# Patient Record
Sex: Female | Born: 2000 | Race: Black or African American | Hispanic: No | Marital: Single | Smoking: Never smoker
Health system: Southern US, Community
[De-identification: ages and names within clinical notes are randomized; demographics above are authoritative.]

## PROBLEM LIST (undated history)

## (undated) DIAGNOSIS — F909 Attention-deficit hyperactivity disorder, unspecified type: Secondary | ICD-10-CM

## (undated) HISTORY — PX: TYMPANOSTOMY TUBE PLACEMENT: SHX32

---
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
## 2022-06-02 DIAGNOSIS — R0981 Nasal congestion: Secondary | ICD-10-CM | POA: Diagnosis not present

## 2022-06-02 DIAGNOSIS — Z130 Encounter for screening for diseases of the blood and blood-forming organs and certain disorders involving the immune mechanism: Secondary | ICD-10-CM | POA: Diagnosis not present

## 2022-06-02 DIAGNOSIS — F419 Anxiety disorder, unspecified: Secondary | ICD-10-CM | POA: Diagnosis not present

## 2022-06-02 DIAGNOSIS — Z13220 Encounter for screening for lipoid disorders: Secondary | ICD-10-CM | POA: Diagnosis not present

## 2022-06-02 DIAGNOSIS — F909 Attention-deficit hyperactivity disorder, unspecified type: Secondary | ICD-10-CM | POA: Diagnosis not present

## 2022-06-02 DIAGNOSIS — Z0000 Encounter for general adult medical examination without abnormal findings: Secondary | ICD-10-CM | POA: Diagnosis not present

## 2022-06-02 DIAGNOSIS — E559 Vitamin D deficiency, unspecified: Secondary | ICD-10-CM | POA: Diagnosis not present

## 2022-06-30 DIAGNOSIS — E559 Vitamin D deficiency, unspecified: Secondary | ICD-10-CM | POA: Diagnosis not present

## 2022-06-30 DIAGNOSIS — F909 Attention-deficit hyperactivity disorder, unspecified type: Secondary | ICD-10-CM | POA: Diagnosis not present

## 2022-07-15 DIAGNOSIS — R040 Epistaxis: Secondary | ICD-10-CM | POA: Diagnosis not present

## 2022-07-15 DIAGNOSIS — H6063 Unspecified chronic otitis externa, bilateral: Secondary | ICD-10-CM | POA: Diagnosis not present

## 2022-07-15 DIAGNOSIS — J301 Allergic rhinitis due to pollen: Secondary | ICD-10-CM | POA: Diagnosis not present

## 2022-08-04 DIAGNOSIS — F419 Anxiety disorder, unspecified: Secondary | ICD-10-CM | POA: Diagnosis not present

## 2022-08-04 DIAGNOSIS — F909 Attention-deficit hyperactivity disorder, unspecified type: Secondary | ICD-10-CM | POA: Diagnosis not present

## 2022-09-10 ENCOUNTER — Other Ambulatory Visit: Payer: Self-pay | Admitting: Family

## 2022-09-10 DIAGNOSIS — D500 Iron deficiency anemia secondary to blood loss (chronic): Secondary | ICD-10-CM

## 2022-09-10 DIAGNOSIS — N944 Primary dysmenorrhea: Secondary | ICD-10-CM

## 2022-09-10 DIAGNOSIS — N920 Excessive and frequent menstruation with regular cycle: Secondary | ICD-10-CM

## 2022-09-17 ENCOUNTER — Other Ambulatory Visit: Payer: Self-pay | Admitting: Family

## 2022-09-17 ENCOUNTER — Encounter: Payer: Self-pay | Admitting: Family

## 2022-09-17 DIAGNOSIS — N920 Excessive and frequent menstruation with regular cycle: Secondary | ICD-10-CM

## 2022-09-17 DIAGNOSIS — N944 Primary dysmenorrhea: Secondary | ICD-10-CM

## 2022-09-17 DIAGNOSIS — D500 Iron deficiency anemia secondary to blood loss (chronic): Secondary | ICD-10-CM

## 2022-09-17 MED ORDER — NORETHIN ACE-ETH ESTRAD-FE 1.5-30 MG-MCG PO TABS
ORAL_TABLET | ORAL | 2 refills | Status: DC
Start: 2022-09-17 — End: 2023-10-02

## 2022-09-26 ENCOUNTER — Encounter: Payer: Self-pay | Admitting: Family

## 2022-09-26 ENCOUNTER — Telehealth (INDEPENDENT_AMBULATORY_CARE_PROVIDER_SITE_OTHER): Payer: BC Managed Care – PPO | Admitting: Family

## 2022-09-26 DIAGNOSIS — L700 Acne vulgaris: Secondary | ICD-10-CM

## 2022-09-26 DIAGNOSIS — N939 Abnormal uterine and vaginal bleeding, unspecified: Secondary | ICD-10-CM

## 2022-09-26 DIAGNOSIS — N921 Excessive and frequent menstruation with irregular cycle: Secondary | ICD-10-CM

## 2022-09-26 MED ORDER — HYDROCORTISONE 0.5 % EX OINT
1.0000 | TOPICAL_OINTMENT | Freq: Two times a day (BID) | CUTANEOUS | 0 refills | Status: AC
Start: 2022-09-26 — End: ?

## 2022-09-26 MED ORDER — ADAPALENE 0.1 % EX CREA
TOPICAL_CREAM | Freq: Every day | CUTANEOUS | 2 refills | Status: DC
Start: 2022-09-26 — End: 2022-10-15

## 2022-09-26 NOTE — Progress Notes (Signed)
THIS RECORD MAY CONTAIN CONFIDENTIAL INFORMATION THAT SHOULD NOT BE RELEASED WITHOUT REVIEW OF THE SERVICE PROVIDER.  Virtual Follow-Up Visit via Video Note  I connected with Morgan Durham  on 09/26/22 at 11:00 AM EDT by a video enabled telemedicine application and verified that I am speaking with the correct person using two identifiers.   Patient/parent location: apartment, Diplomatic Services operational officer location: remote [place]   I discussed the limitations of evaluation and management by telemedicine and the availability of in person appointments.  I discussed that the purpose of this telehealth visit is to provide medical care while limiting exposure to the novel coronavirus.  The patient expressed understanding and agreed to proceed.   Morgan Durham is a 22 y.o. female referred by Izola Price, MD here today for follow-up of breakthrough bleeding on birth control pills.   History was provided by the patient.  Supervising Physician: Dr. Theadore Nan   Plan from Last Visit 11/04/21  -screening for infections today  -also discussed condom use, EC and birth control counseling  -does not want to restart at this time; will send EC to pharmacy so she has access as needed  -negative pregnancy test today  -follow up in my chart video in one week or sooner pending results    1. Vaginal irritation 2. Routine screening for STI (sexually transmitted infection) - WET PREP BY MOLECULAR PROBE - RPR - HIV Antibody (routine testing w rflx) - C. trachomatis/N. gonorrhoeae RNA   3. 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 (Patient not taking: Reported on 11/04/2021)     ALPRAZolam (NIRAVAM) 0.25 MG dissolvable tablet Take 1 tablet (0.25 mg total) by mouth once as needed for up to 1 dose for anxiety. (Patient not taking: Reported on 04/09/2020) 4 tablet 0   clindamycin-benzoyl peroxide (BENZACLIN) gel APPLY TOPICALLY EVERY MORNING. (Patient not taking: Reported on 11/04/2021) 25 g 11   escitalopram (LEXAPRO) 5 MG tablet Take 10 mg by mouth daily.     hydrOXYzine (ATARAX/VISTARIL) 25 MG tablet Take 1 tablet (25 mg total) by mouth every 8 (eight) hours as needed for anxiety. (Patient not taking: Reported on 11/04/2021) 20 tablet 0   hydrOXYzine (VISTARIL) 25 MG capsule Take 1 capsule (25 mg total) by mouth daily. (Patient not taking: Reported on 11/04/2021) 30 capsule 0   metoCLOPramide (REGLAN) 10 MG/10ML SOLN Take 10 mLs (10 mg total) by mouth every 8 (eight) hours as needed for nausea. (Patient not taking: Reported on 11/04/2021) 120 mL 0   Multiple Vitamin (MULTIVITAMIN) tablet Take 1 tablet by mouth daily. (Patient not taking: Reported on 11/04/2021)     Naproxen 375 MG TBEC TAKE 1 TABLET (375 MG TOTAL) BY MOUTH IN THE MORNING AND AT BEDTIME. (Patient not taking: Reported on  11/04/2021) 60 tablet 3   norethindrone-ethinyl estradiol-iron (JUNEL FE 1.5/30) 1.5-30 MG-MCG tablet TAKE ONE PILL BY MOUTH DAILY. YOU WILL NOT TAKE PLACEBO PILLS. 112 tablet 2   polyethylene glycol powder (GLYCOLAX/MIRALAX) 17 GM/SCOOP powder Take 17 g by mouth daily. (Patient not taking: Reported on 04/09/2020) 578 g 6   sertraline (ZOLOFT) 25 MG tablet  Take 25 mg by mouth daily. (Patient not taking: Reported on 11/04/2021)     No facility-administered medications prior to visit.     Patient Active Problem List   Diagnosis Date Noted   Menorrhagia with regular cycle 05/26/2019   Primary dysmenorrhea 05/26/2019   Oppositional defiant disorder 03/17/2016   ADHD (attention deficit hyperactivity disorder), combined type 06/06/2015     The following portions of the patient's history were reviewed and updated as appropriate: allergies, current medications, past family history, past medical history, past social history, past surgical history, and problem list.  Visual Observations/Objective:   General Appearance: Well nourished well developed, in no apparent distress.  Eyes: conjunctiva no swelling or erythema ENT/Mouth: No hoarseness, No cough for duration of visit.  Neck: Supple  Respiratory: Respiratory effort normal, normal rate, no retractions or distress.   Cardio: Appears well-perfused, noncyanotic Musculoskeletal: no obvious deformity Skin: visible skin without rashes, ecchymosis, erythema; scant acne on forehead, minimal on cheeks, some hyperpigmentation noted  Neuro: Awake and oriented X 3,  Psych:  normal affect, Insight and Judgment appropriate.    Assessment/Plan: 1. Breakthrough bleeding on birth control pills -advised to take one week off pills then restart to reset cycle  -return precautions reviewe d -can consider change from 1st gen to 2nd gen birth control if needed   2. Acne vulgaris -trial of differin and hydrocortisone for post-inflammatory hyperpigmented areas - adapalene (DIFFERIN) 0.1 % cream; Apply topically at bedtime.  Dispense: 45 g; Refill: 2 - hydrocortisone ointment 0.5 %; Apply 1 Application topically 2 (two) times daily. Apply to affected inflammed areas.  Dispense: 30 g; Refill: 0    I discussed the assessment and treatment plan with the patient and/or parent/guardian.  They were provided an  opportunity to ask questions and all were answered.  They agreed with the plan and demonstrated an understanding of the instructions. They were advised to call back or seek an in-person evaluation in the emergency room if the symptoms worsen or if the condition fails to improve as anticipated.   Follow-up:   as needed    Georges Mouse, NP    CC: Izola Price, MD, Izola Price, MD

## 2022-10-03 DIAGNOSIS — H04123 Dry eye syndrome of bilateral lacrimal glands: Secondary | ICD-10-CM | POA: Diagnosis not present

## 2022-10-03 DIAGNOSIS — H1045 Other chronic allergic conjunctivitis: Secondary | ICD-10-CM | POA: Diagnosis not present

## 2022-10-03 DIAGNOSIS — H5203 Hypermetropia, bilateral: Secondary | ICD-10-CM | POA: Diagnosis not present

## 2022-10-14 ENCOUNTER — Encounter: Payer: Self-pay | Admitting: Family

## 2022-10-15 ENCOUNTER — Other Ambulatory Visit: Payer: Self-pay | Admitting: Family

## 2022-12-16 IMAGING — CT CT HEAD W/O CM
4 series · 17 of 47 positions shown, 19 images · non-contrast
Comparison: None.

CLINICAL DATA: Altered mental status

EXAM:
CT HEAD WITHOUT CONTRAST
TECHNIQUE: Contiguous axial images were obtained from the base of the skull
through the vertex without intravenous contrast.

[Series 3: head wo · axial · 0.41mm/px · z∈[-173,-53]mm · 7 of 32 slices shown, 9 images]
[im 4/32  brain]
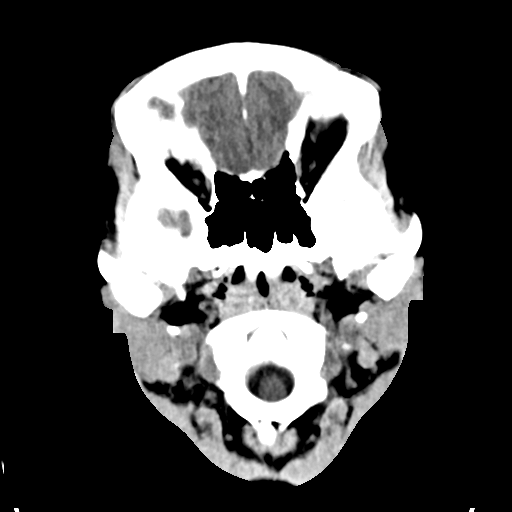
[im 4/32  bone]
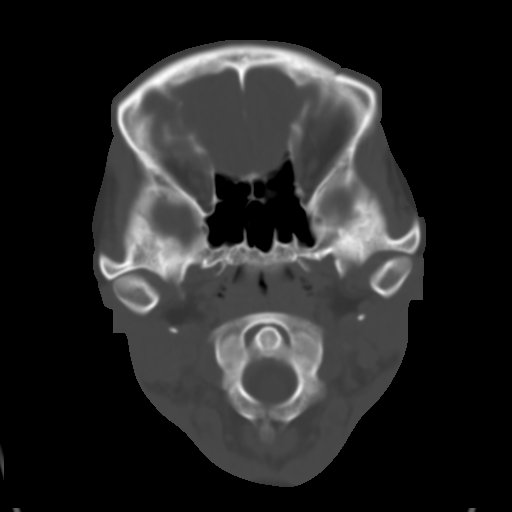
[im 8/32  brain]
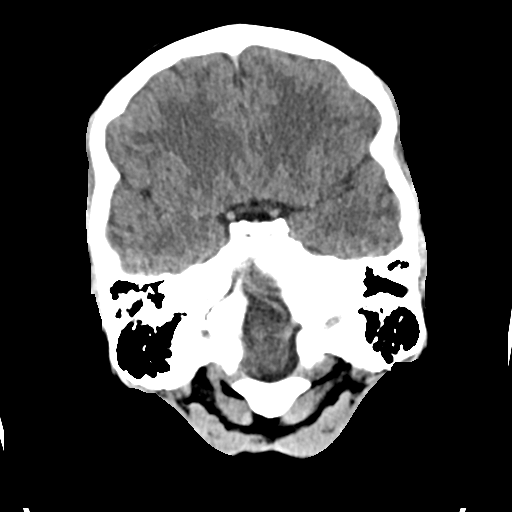
[im 12/32  brain]
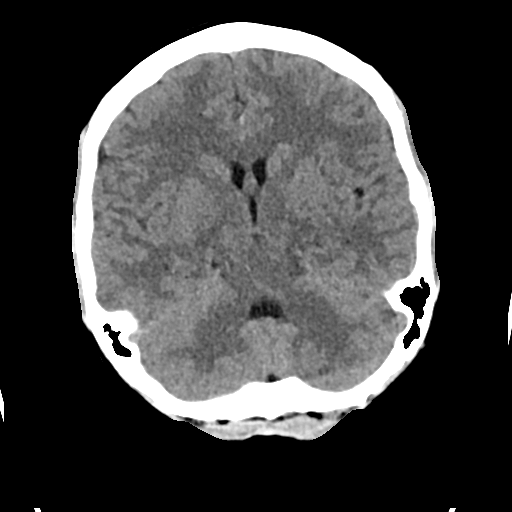
[im 16/32  brain]
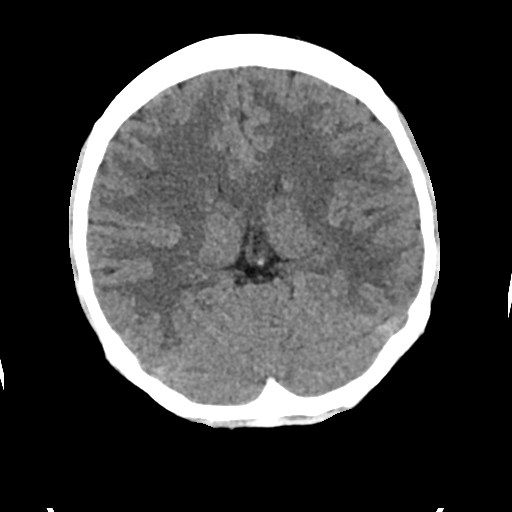
[im 20/32  brain]
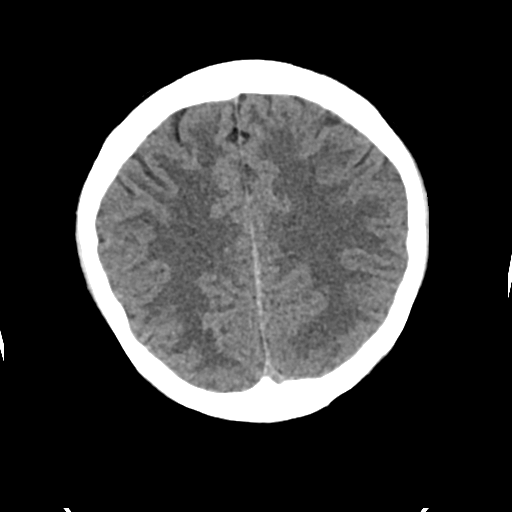
[im 20/32  bone]
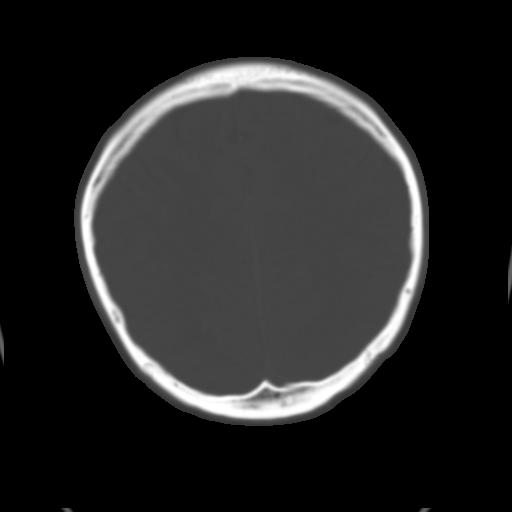
[im 24/32  brain]
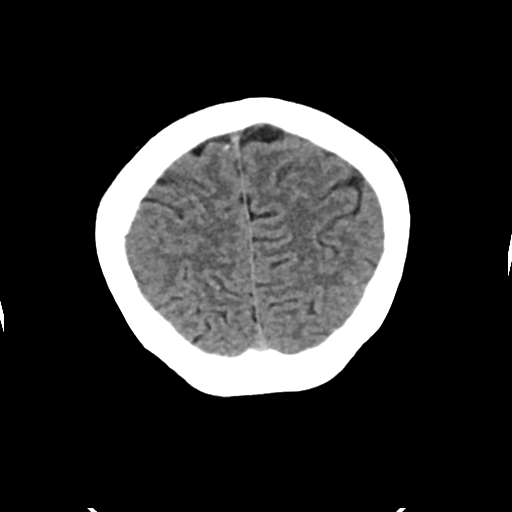
[im 28/32  brain]
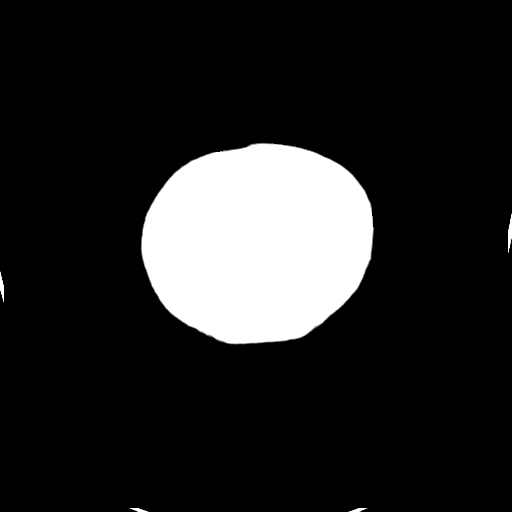

[Series 4: head bone · axial · 0.41mm/px · z∈[-174,-118]mm · 4 of 79 slices shown]
[im 8/79  bone]
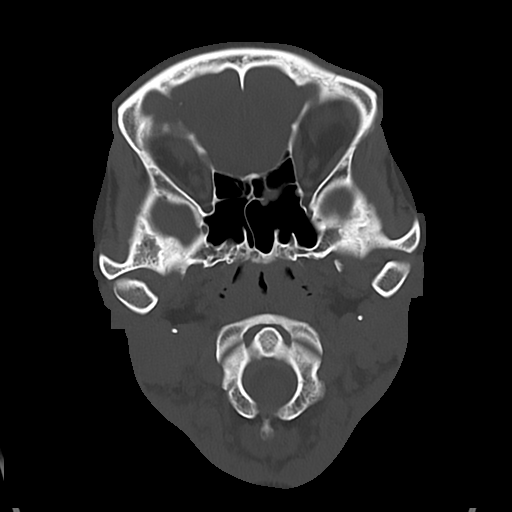
[im 16/79  bone]
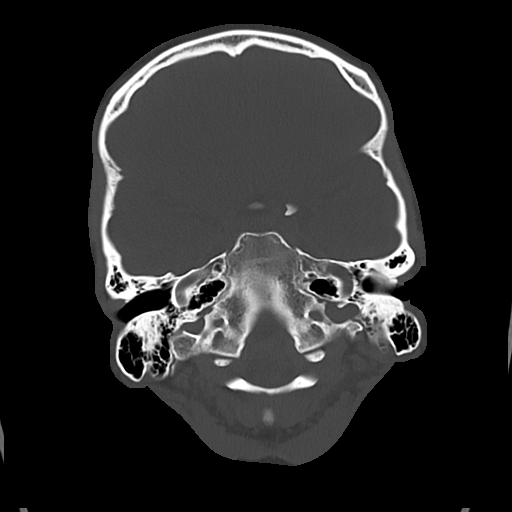
[im 24/79  bone]
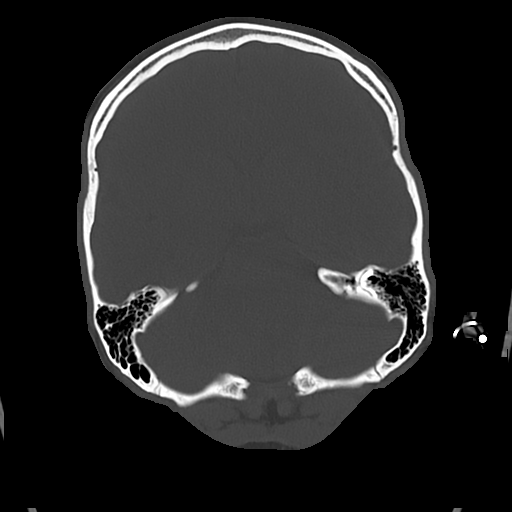
[im 36/79  bone]
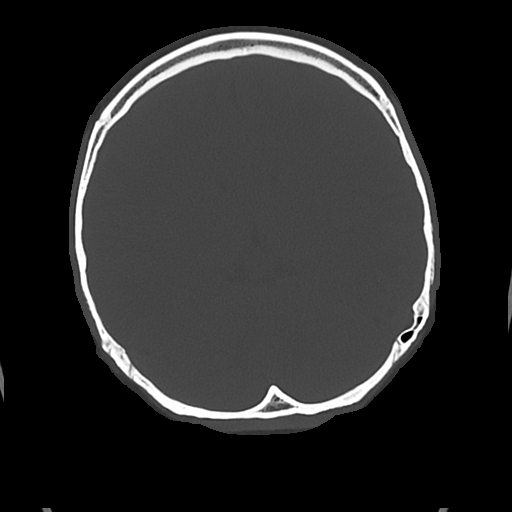

[Series 5: cor soft · coronal · 0.35mm/px · 3 of 66 slices shown]
[im 22/66  brain]
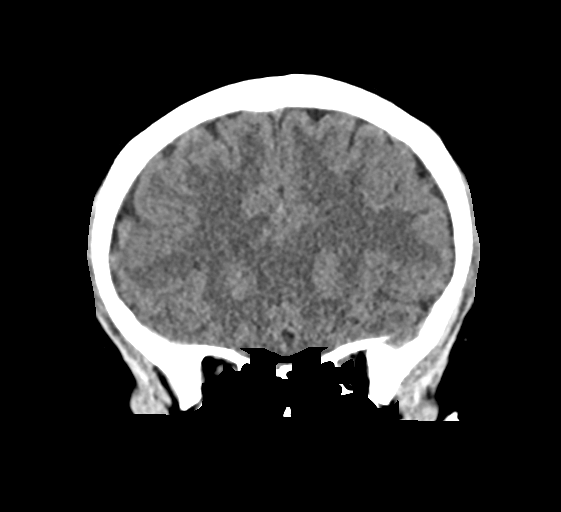
[im 29/66  brain]
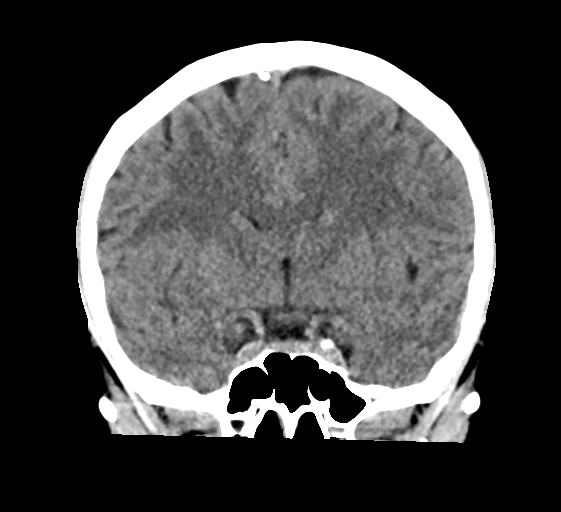
[im 37/66  brain]
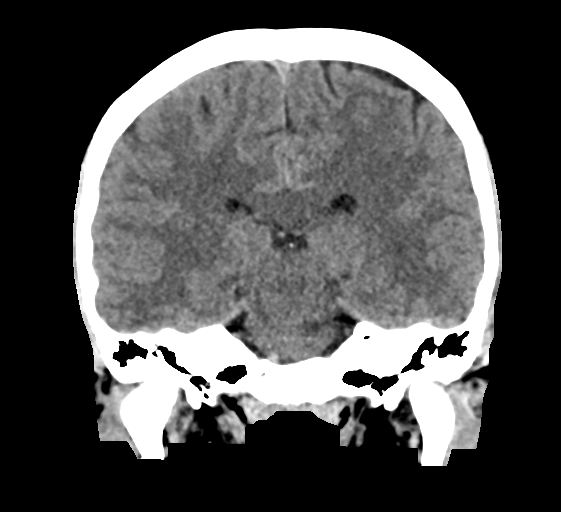

[Series 6: sag soft · sagittal · 0.35mm/px · 3 of 65 slices shown]
[im 22/65  brain]
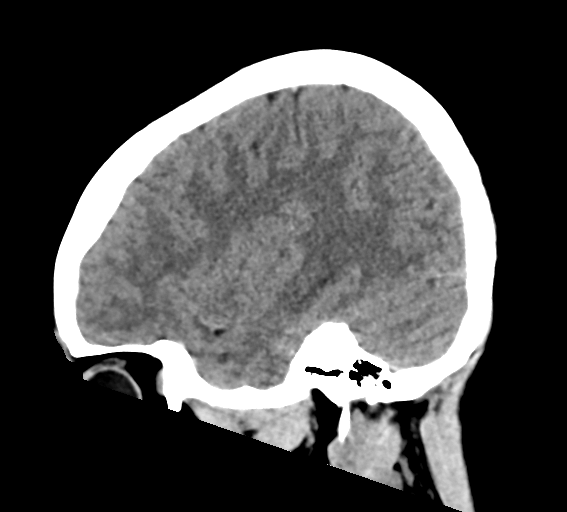
[im 33/65  brain]
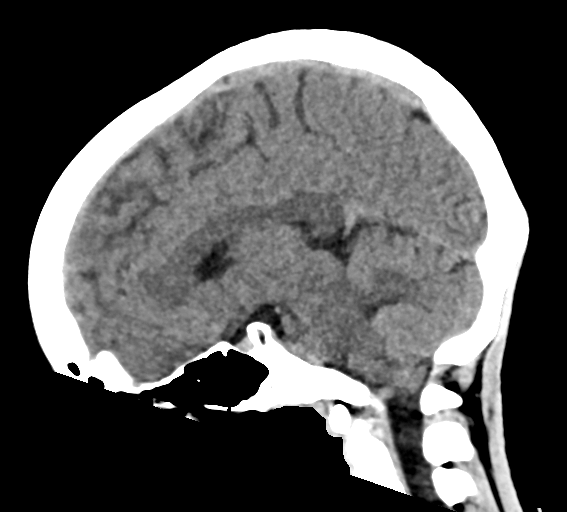
[im 43/65  brain]
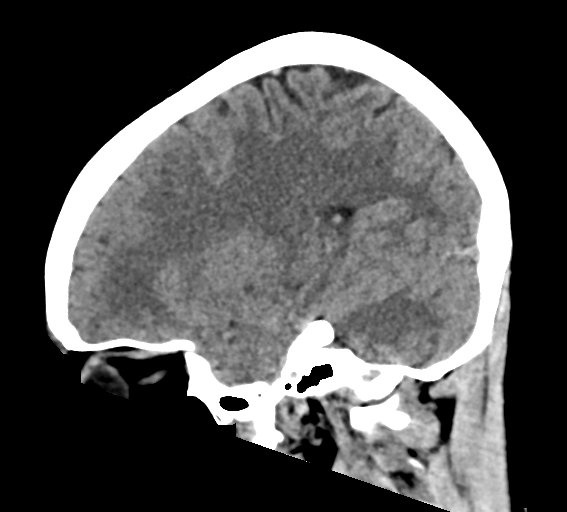

[17 of 47 positions shown; findings below may reference images not displayed]

FINDINGS: Brain: Normal anatomic configuration. No abnormal intra or
extra-axial mass lesion or fluid collection. No abnormal mass effect
or midline shift. No evidence of acute intracranial hemorrhage or
infarct. Ventricular size is normal. Cerebellum unremarkable.

Vascular: Unremarkable

Skull: Intact

Sinuses/Orbits: Paranasal sinuses are clear. Orbits are
unremarkable.

Other: Mastoid air cells and middle ear cavities are clear.
IMPRESSION: Normal examination

## 2023-04-21 ENCOUNTER — Encounter: Payer: Self-pay | Admitting: Family

## 2023-04-22 ENCOUNTER — Telehealth (INDEPENDENT_AMBULATORY_CARE_PROVIDER_SITE_OTHER): Admitting: Family

## 2023-04-22 DIAGNOSIS — L819 Disorder of pigmentation, unspecified: Secondary | ICD-10-CM | POA: Diagnosis not present

## 2023-04-22 DIAGNOSIS — N926 Irregular menstruation, unspecified: Secondary | ICD-10-CM

## 2023-04-22 DIAGNOSIS — L700 Acne vulgaris: Secondary | ICD-10-CM | POA: Diagnosis not present

## 2023-04-22 NOTE — Progress Notes (Unsigned)
 THIS RECORD MAY CONTAIN CONFIDENTIAL INFORMATION THAT SHOULD NOT BE RELEASED WITHOUT REVIEW OF THE SERVICE PROVIDER.  Virtual Follow-Up Visit via Video Note  I connected with Morgan Durham 's {family members:20773}  on 04/22/23 at  9:00 AM EDT by a video enabled telemedicine application and verified that I am speaking with the correct person using two identifiers.   Patient/parent location: *** Provider location: ***   I discussed the limitations of evaluation and management by telemedicine and the availability of in person appointments.  I discussed that the purpose of this telehealth visit is to provide medical care while limiting exposure to the novel coronavirus.  The {family members:20773} expressed understanding and agreed to proceed.   Morgan Durham is a 23 y.o. female referred by Izola Price, MD here today for follow-up of ***.  Previsit planning completed:  {YES/NO/NOT APPLICABLE:20182}   History was provided by the {CHL AMB PERSONS; PED RELATIVES/OTHER W/PATIENT:[id number]}.  Supervising Physician: Dr. Theadore Nan   Plan from Last Visit:   ***  Chief Complaint: ***  History of Present Illness:  -darkening around mouth  -some on cheeks as well  -more pimples here and there, under the surface  -only drinking water now, trying to cut down on sweet tea and juices  -skin regimen: goat's milk soap from farmer's market; then will put any kind of [employer] moisturizer; started adapalene a few times through the month, now about every other day;  -feels like the darkening happened after COVID with masks and then when using the Benzoyl peroxide and no sunscreen   -has been the placebo pills but no period for about two months  -not sexually active Sept 2023 so not concerned about  -place on leg near L ankle;  not sure if it is fluid, legs have been swollen; not sure if gaining weight - went to about 180 lb; was hard on body walking on campus; no pain but  starting to limp on that leg a little; is about at 150 lb now;  -in pill pack now: middle of pack    No Known Allergies Outpatient Medications Prior to Visit  Medication Sig Dispense Refill   hydrocortisone ointment 0.5 % Apply 1 Application topically 2 (two) times daily. Apply to affected inflammed areas. 30 g 0   norethindrone-ethinyl estradiol-iron (JUNEL FE 1.5/30) 1.5-30 MG-MCG tablet TAKE ONE PILL BY MOUTH DAILY. YOU WILL NOT TAKE PLACEBO PILLS. 112 tablet 2   No facility-administered medications prior to visit.     Patient Active Problem List   Diagnosis Date Noted   Menorrhagia with regular cycle 05/26/2019   Primary dysmenorrhea 05/26/2019   ADHD (attention deficit hyperactivity disorder), combined type 06/06/2015    Social History: Lives with:  {Persons; PED relatives w/patient:19415} and describes home situation as *** School: In Grade *** at [employer] Future Plans:  {CHL AMB PED FUTURE ZOXWR:[id number]} Exercise:  {Exercise:23478} Sports:  {Misc; sports:10024} Sleep:  {SX; SLEEP PATTERNS:18802}  Confidentiality was discussed with the patient and if applicable, with caregiver as well.  Patient's personal or confidential phone number: *** Enter confidential phone number in Family Comments section of [employer] Tobacco?  {YES/NO/WILD BJYNW:29562} Drugs/ETOH?  {YES/NO/WILD ZHYQM:57846} Partner preference?  {CHL AMB PARTNER [phone]} Sexually Active?  {YES/NO/WILD NGEXB:28413}  Pregnancy Prevention:  {Pregnancy Prevention:[phone]}, reviewed condoms & plan B Trauma currently or in the pastt?  {YES/NO/WILD KGMWN:02725} Suicidal or Self-Harm thoughts?   {YES/NO/WILD DGUYQ:03474} Guns in the home?  {YES/NO/WILD QVZDG:38756}  {Common ambulatory SmartLinks:19316}  Visual  Observations/Objective:  *** General Appearance: Well nourished well developed, in no apparent distress.  Eyes: conjunctiva no swelling or erythema ENT/Mouth: No hoarseness, No cough for  duration of visit.  Neck: Supple  Respiratory: Respiratory effort normal, normal rate, no retractions or distress.   Cardio: Appears well-perfused, noncyanotic Musculoskeletal: no obvious deformity Skin: visible skin without rashes, ecchymosis, erythema Neuro: Awake and oriented X 3,  Psych:  normal affect, Insight and Judgment appropriate.    Assessment/Plan: There are no diagnoses linked to this encounter.  BH screenings:      No data to display         *** Screens discussed with patient and parent and adjustments to plan made accordingly.   I discussed the assessment and treatment plan with the patient and/or parent/guardian.  They were provided an opportunity to ask questions and all were answered.  They agreed with the plan and demonstrated an understanding of the instructions. They were advised to call back or seek an in-person evaluation in the emergency room if the symptoms worsen or if the condition fails to improve as anticipated.   Follow-up:   ***  I spent >*** minutes spent face to face with patient with more than 50% of appointment spent discussing diagnosis, management, follow-up, and reviewing of ***. I spent an additional *** minutes on pre-and post-visit activities. I was located *** during this encounter.   Georges Mouse, NP    CC: Izola Price, MD, Izola Price, MD

## 2023-04-23 ENCOUNTER — Telehealth: Payer: Self-pay | Admitting: Pediatrics

## 2023-04-23 ENCOUNTER — Encounter: Payer: Self-pay | Admitting: Family

## 2023-04-23 MED ORDER — NORETHINDRONE ACETATE 5 MG PO TABS: 10.0000 mg | ORAL_TABLET | Freq: Every day | ORAL | 0 refills | Status: DC

## 2023-04-23 NOTE — Telephone Encounter (Signed)
 Called patient and left message to return call regarding appointment with Bernell List.

## 2023-04-24 ENCOUNTER — Other Ambulatory Visit: Payer: Self-pay | Admitting: Family

## 2023-04-24 DIAGNOSIS — E559 Vitamin D deficiency, unspecified: Secondary | ICD-10-CM

## 2023-04-24 DIAGNOSIS — N926 Irregular menstruation, unspecified: Secondary | ICD-10-CM

## 2023-04-24 DIAGNOSIS — Z3202 Encounter for pregnancy test, result negative: Secondary | ICD-10-CM

## 2023-04-27 ENCOUNTER — Telehealth: Payer: Self-pay | Admitting: Pediatrics

## 2023-04-27 NOTE — Telephone Encounter (Signed)
 Called patient and left message to return call regarding lab visit.

## 2023-05-05 ENCOUNTER — Encounter: Payer: Self-pay | Admitting: Family

## 2023-05-06 ENCOUNTER — Other Ambulatory Visit: Payer: Self-pay | Admitting: Family

## 2023-05-06 DIAGNOSIS — L819 Disorder of pigmentation, unspecified: Secondary | ICD-10-CM

## 2023-05-06 DIAGNOSIS — L700 Acne vulgaris: Secondary | ICD-10-CM

## 2023-05-21 ENCOUNTER — Encounter: Admitting: Family

## 2023-06-08 ENCOUNTER — Ambulatory Visit (INDEPENDENT_AMBULATORY_CARE_PROVIDER_SITE_OTHER): Admitting: Family

## 2023-06-08 VITALS — BP 114/75 | HR 83 | Ht 61.0 in | Wt 151.0 lb

## 2023-06-08 DIAGNOSIS — Z113 Encounter for screening for infections with a predominantly sexual mode of transmission: Secondary | ICD-10-CM

## 2023-06-08 DIAGNOSIS — N926 Irregular menstruation, unspecified: Secondary | ICD-10-CM

## 2023-06-08 DIAGNOSIS — Z3202 Encounter for pregnancy test, result negative: Secondary | ICD-10-CM

## 2023-06-08 LAB — POCT URINE PREGNANCY: Preg Test, Ur: NEGATIVE

## 2023-06-08 NOTE — Progress Notes (Signed)
 History was provided by the patient and mother.  Morgan Durham is a 23 y.o. female who is here for irregular periods.   PCP confirmed? Yes.    Alvin Tatjana Turcott, MD  Plan from last visit:  1. Hyperpigmentation (Primary) 2. Acne vulgaris -acne not notable at this time; hyperpigmentation on face ddx melasma, erythema dyschromicum perstans, perioral dermatitis; advised some research supports niacinamide 4% cream on affected areas x 8 weeks, however recommended referral to Dr [facility] for further evaluation and establish care.  Recommended daily gentle face wash like Cetaphil or Cerave with daily moisturizer with SPF. Could consider doxycyline for acne management, however skin is clear excluding hyperpigmentation at this time; also consideration for benzaclin  gel instead of adapalene .    3. Irregular periods -she has missed the last 2 periods despite taking placebo (4th row) of pill pack, not sexually active; ddx includes endocrine or metabolic etiologies, pregnancy; advised blood work in [place] at [place] to assess further. Consideration that hormonal changes may also be impacting hyperpigmentation above.    I discussed the assessment and treatment plan with the patient and/or parent/guardian.  They were provided an opportunity to ask questions and all were answered.  They agreed with the plan and demonstrated an understanding of the instructions. They were advised to call back or seek an in-person evaluation in the emergency room if the symptoms worsen or if the condition fails to improve as anticipated.     Follow-up:   pending labs above (will discuss further through My Chart to figure out which lab is closest to her in [place])      HPI:   -period came on before she took the progesterone challenge, did not need to take it -LMP 04/09  -was 8 days late and on period now -stopped taking birth control pills completely, wants to see how her cycle will do without  it  -no cramping and bleeding  -question about weight - got kind of out of control when she first was in college; lost weight going to gym and eating better; wants to make sure she is healthy size now    Patient Active Problem List   Diagnosis Date Noted   Menorrhagia with regular cycle 05/26/2019   Primary dysmenorrhea 05/26/2019   ADHD (attention deficit hyperactivity disorder), combined type 06/06/2015    Current Outpatient Medications on File Prior to Visit  Medication Sig Dispense Refill   hydrocortisone  ointment 0.5 % Apply 1 Application topically 2 (two) times daily. Apply to affected inflammed areas. 30 g 0   methylphenidate  18 MG PO CR tablet Take 18 mg by mouth daily.     norethindrone  (AYGESTIN ) 5 MG tablet Take 2 tablets (10 mg total) by mouth daily for 10 days. 20 tablet 0   norethindrone -ethinyl estradiol-iron (JUNEL FE 1.5/30) 1.5-30 MG-MCG tablet TAKE ONE PILL BY MOUTH DAILY. YOU WILL NOT TAKE PLACEBO PILLS. 112 tablet 2   propranolol (INDERAL) 10 MG tablet Take 10 mg by mouth 2 (two) times daily as needed.     No current facility-administered medications on file prior to visit.    No Known Allergies  Physical Exam:    Vitals:   06/08/23 1335  BP: 114/75  Pulse: 83  Weight: 151 lb (68.5 kg)  Height: 5\' 1"  (1.549 m)   Wt Readings from Last 3 Encounters:  06/08/23 151 lb (68.5 kg)  11/04/21 148 lb 12.8 oz (67.5 kg) (80%, Z= 0.83)*  04/17/20 136 lb 0.4 oz (61.7 kg) (71%,  Z= 0.55)*   * Growth percentiles are based on CDC (Girls, 2-20 Years) data.     Growth %ile SmartLinks can only be used for patients less than 79 years old. No LMP recorded.  Physical Exam Constitutional:      General: She is not in acute distress.    Appearance: She is well-developed.  HENT:     Head: Normocephalic and atraumatic.  Eyes:     General: No scleral icterus.    Extraocular Movements: Extraocular movements intact.     Pupils: Pupils are equal, round, and reactive to  light.  Neck:     Thyroid : No thyromegaly.  Cardiovascular:     Rate and Rhythm: Normal rate and regular rhythm.     Heart sounds: Normal heart sounds. No murmur heard. Pulmonary:     Effort: Pulmonary effort is normal.     Breath sounds: Normal breath sounds.  Abdominal:     Palpations: Abdomen is soft.  Musculoskeletal:        General: Normal range of motion.     Cervical back: Normal range of motion and neck supple.  Lymphadenopathy:     Cervical: No cervical adenopathy.  Skin:    General: Skin is warm and dry.     Findings: No rash.  Neurological:     Mental Status: She is alert and oriented to person, place, and time.     Cranial Nerves: No cranial nerve deficit.     Motor: No tremor.  Psychiatric:        Attention and Perception: Attention normal.        Mood and Affect: Mood normal.        Speech: Speech normal.        Behavior: Behavior normal.        Thought Content: Thought content normal.        Judgment: Judgment normal.      Assessment/Plan:  1. Irregular periods (Primary) -discussed that expected cycles ranges from every 21 to 35 days between first days of cycles; continue to monitor; discussed not going more than 90 days between cycles to avoid endometrial hyperplasia; for now, she would like to stay off hormones; follow up in 3 months or sooner if needed.  For now, no indication for labs or pelvic exam; reviewed weight trend, no concern for eating disorder, severe caloric restriction, or compensatory behaviors.  2. Routine screening for STI (sexually transmitted infection) - C. trachomatis/N. gonorrhoeae RNA  3. Pregnancy examination or test, negative result - POCT urine pregnancy

## 2023-06-09 LAB — C. TRACHOMATIS/N. GONORRHOEAE RNA
C. trachomatis RNA, TMA: NOT DETECTED
N. gonorrhoeae RNA, TMA: NOT DETECTED

## 2023-06-11 ENCOUNTER — Encounter: Payer: Self-pay | Admitting: Family

## 2023-06-30 ENCOUNTER — Encounter: Payer: Self-pay | Admitting: Dermatology

## 2023-06-30 ENCOUNTER — Ambulatory Visit: Admitting: Dermatology

## 2023-06-30 VITALS — BP 100/69

## 2023-06-30 DIAGNOSIS — L709 Acne, unspecified: Secondary | ICD-10-CM | POA: Diagnosis not present

## 2023-06-30 DIAGNOSIS — L219 Seborrheic dermatitis, unspecified: Secondary | ICD-10-CM

## 2023-06-30 DIAGNOSIS — L700 Acne vulgaris: Secondary | ICD-10-CM

## 2023-06-30 MED ORDER — CLINDAMYCIN PHOSPHATE 1 % EX SWAB: 1.0000 | Freq: Every day | CUTANEOUS | 5 refills | Status: AC

## 2023-06-30 MED ORDER — FLUOCINOLONE ACETONIDE SCALP 0.01 % EX OIL: TOPICAL_OIL | CUTANEOUS | 5 refills | Status: AC

## 2023-06-30 MED ORDER — SAFETY SEAL MISCELLANEOUS MISC: 5 refills | Status: AC

## 2023-06-30 MED ORDER — CLOBETASOL PROPIONATE 0.05 % EX SOLN: 1.0000 | Freq: Every day | CUTANEOUS | 0 refills | Status: AC | PRN

## 2023-06-30 MED ORDER — TRETINOIN 0.05 % EX CREA: TOPICAL_CREAM | CUTANEOUS | 2 refills | Status: AC

## 2023-06-30 NOTE — Progress Notes (Signed)
   New Patient Visit   Subjective  Morgan Durham is a 23 y.o. female who presents for the following: New Pt - Acne w/ PIH - Seb Derm   Patient states she has acne w/ PIH located at the face that she would like to have examined. Patient reports the areas have been present since puberty. She reports the cystic break outs are bothersome. Patient rates irritation 5 out of 10. She states that the areas have not spread. Patient reports she has previously been treated for these areas by another dermatologist years ago and does not remember the product names that she was prescribed. Patient denied Hx of bx. Patient denied family history of skin cancer(s). Patient also has complaints of Seb Derm at the scalp that she has noticed has worsened over the last year. She has not tried any OTC products and mentioned that her hair stylist recommended that she be evaluated for scalp concerns.    The following portions of the chart were reviewed this encounter and updated as appropriate: medications, allergies, medical history  Review of Systems:  No other skin or systemic complaints except as noted in HPI or Assessment and Plan.  Objective  Well appearing patient in no apparent distress; mood and affect are within normal limits.   A focused examination was performed of the following areas: face & scalp   Relevant exam findings are noted in the Assessment and Plan.               Assessment & Plan    1. Acne - Assessment:  Patient reports acne with larger flares around menstrual cycle. She has been using over-the-counter products including Cetaphil Daily Cleanser and Vanicream HC Daily Moisturizer. No previous use of tretinoin. Current regimen has been ineffective in controlling acne.  - Plan:    Start tretinoin cream at night, twice weekly for the first month, then increase to three nights if no irritation     - Apply pea-sized amount     - Informed patient of potential skin irritation and  need for gradual increase in frequency    Start clindamycin swabs in the morning    Continue Cetaphil cleanser in the morning    Continue Vanicream HC Moisturizer after tretinoin at night    Prescribe lightening cream with tranexamic acid, vitamin C, kojic acid, and resveratrol for dark spots     - Use on nights when not using tretinoin    Recommend daily sunscreen use    Follow up in 4 months    If not clear or almost clear in 4 months, consider adding spironolactone  2. Seborrheic Dermatitis of the Scalp - Assessment: Patient presents with itchy flakiness in the scalp, consistent with seborrheic dermatitis. Current management has been inadequate.  - Plan:    Use DHS zinc shampoo every two weeks     - Let sit for 2-3 minutes before rinsing    Start clobetasol liquid drops for itching    Prescribe fluocinonide oil for scalp dryness    Advise against putting oils on scalp    No follow-ups on file.   Documentation: I have reviewed the above documentation for accuracy and completeness, and I agree with the above.  I, Shirron Louanne Roussel, CMA, am acting as scribe for [employer], DO.   Louana Roup, DO

## 2023-06-30 NOTE — Patient Instructions (Addendum)
 Date: Tue Jun 30 2023  Hello Morgan Durham,  Thank you for visiting today. Here is a summary of the key instructions:  - Medications:   - Use Cetaphil cleanser in the morning   - Apply clindamycin swabs after cleansing in the morning   - Use tretinoin cream at night:     - Start with twice a week for the first month     - Increase to three nights a week if no irritation     - Use a pea-sized amount   - Apply Vanicream HC Moisturizer after tretinoin at night   - Use prescribed compounded lightening cream on nights without tretinoin  - Skin Care:   - Apply moisturizer after morning cleansing   - Use sunscreen daily   - Continue using [place] sunscreen  - Scalp Care:   - Shampoo every two weeks with DHS zinc shampoo     - Let it sit for 2-3 minutes before rinsing   - Use clobetasol liquid drops for itching   - Avoid putting oils on your scalp   - Use fluocinonide oil if scalp becomes dry  - Follow-up: Return for a follow-up appointment in four months  Please reach out if you have any questions or concerns.  Warm regards,  Dr. Louana Roup, Dermatology   Recommended Sunscreen       Important Information  Due to recent changes in healthcare laws, you may see results of your pathology and/or laboratory studies on MyChart before the doctors have had a chance to review them. We understand that in some cases there may be results that are confusing or concerning to you. Please understand that not all results are received at the same time and often the doctors may need to interpret multiple results in order to provide you with the best plan of care or course of treatment. Therefore, we ask that you please give us  2 business days to thoroughly review all your results before contacting the office for clarification. Should we see a critical lab result, you will be contacted sooner.   If You Need Anything After Your Visit  If you have any questions or concerns for your doctor, please  call our main line at [phone] If no one answers, please leave a voicemail as directed and we will return your call as soon as possible. Messages left after 4 pm will be answered the following business day.   You may also send us  a message via MyChart. We typically respond to MyChart messages within 1-2 business days.  For prescription refills, please ask your pharmacy to contact our office. Our fax number is [phone].  If you have an urgent issue when the clinic is closed that cannot wait until the next business day, you can page your doctor at the number below.    Please note that while we do our best to be available for urgent issues outside of office hours, we are not available 24/7.   If you have an urgent issue and are unable to reach us , you may choose to seek medical care at your doctor's office, retail clinic, urgent care center, or emergency room.  If you have a medical emergency, please immediately call 911 or go to the emergency department. In the event of inclement weather, please call our main line at [phone] for an update on the status of any delays or closures.  Dermatology Medication Tips: Please keep the boxes that topical medications come in in order to help keep  track of the instructions about where and how to use these. Pharmacies typically print the medication instructions only on the boxes and not directly on the medication tubes.   If your medication is too expensive, please contact our office at [phone] or send us  a message through MyChart.   We are unable to tell what your co-pay for medications will be in advance as this is different depending on your insurance coverage. However, we may be able to find a substitute medication at lower cost or fill out paperwork to get insurance to cover a needed medication.   If a prior authorization is required to get your medication covered by your insurance company, please allow us  1-2 business days to complete  this process.  Drug prices often vary depending on where the prescription is filled and some pharmacies may offer cheaper prices.  The website www.goodrx.com contains coupons for medications through different pharmacies. The prices here do not account for what the cost may be with help from insurance (it may be cheaper with your insurance), but the website can give you the price if you did not use any insurance.  - You can print the associated coupon and take it with your prescription to the pharmacy.  - You may also stop by our office during regular business hours and pick up a GoodRx coupon card.  - If you need your prescription sent electronically to a different pharmacy, notify our office through [facility] or by phone at [phone]

## 2023-09-11 ENCOUNTER — Telehealth: Admitting: Family

## 2023-10-02 ENCOUNTER — Encounter: Payer: Self-pay | Admitting: Family

## 2023-10-02 ENCOUNTER — Telehealth: Admitting: Family

## 2023-10-02 DIAGNOSIS — Z30011 Encounter for initial prescription of contraceptive pills: Secondary | ICD-10-CM | POA: Diagnosis not present

## 2023-10-02 MED ORDER — NORETHIN ACE-ETH ESTRAD-FE 1.5-30 MG-MCG PO TABS: 1.0000 | ORAL_TABLET | Freq: Every day | ORAL | 11 refills | Status: AC

## 2023-10-02 NOTE — Progress Notes (Signed)
 THIS RECORD MAY CONTAIN CONFIDENTIAL INFORMATION THAT SHOULD NOT BE RELEASED WITHOUT REVIEW OF THE SERVICE PROVIDER.  Virtual Follow-Up Visit via Video Note  I connected with Morgan Durham  on 10/02/23 at 10:00 AM EDT by a video enabled telemedicine application and verified that I am speaking with the correct person using two identifiers.   Patient/parent location: apartment, [place] Provider location: remote, [place]   I discussed the limitations of evaluation and management by telemedicine and the availability of in person appointments.  I discussed that the purpose of this telehealth visit is to provide medical care while limiting exposure to the novel coronavirus.  The patient expressed understanding and agreed to proceed.   Morgan Durham is a 23 y.o. female referred by Clide Asberry BRAVO, MD here today for follow-up of birth control.    History was provided by the patient.  Supervising Physician: Dr. 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 60 each 5   clobetasol  (TEMOVATE ) 0.05 % external solution Apply 1 Application topically daily as needed. Apply to affected areas of the scalp PRN daily for itching 50 mL 0   Fluocinolone  Acetonide Scalp (DERMA-SMOOTHE /FS SCALP) 0.01 % OIL Apply to scalp a  few hours prior to washing hair. 20 mL 5   hydrocortisone  ointment 0.5 % Apply 1 Application topically 2 (two) times daily. Apply to affected inflammed areas. 30 g 0   methylphenidate  18 MG PO CR tablet Take 18 mg by mouth daily.     norethindrone -ethinyl estradiol-iron (JUNEL FE 1.5/30) 1.5-30 MG-MCG tablet TAKE ONE PILL BY MOUTH DAILY. YOU WILL NOT TAKE PLACEBO PILLS. 112 tablet 2   propranolol (INDERAL) 10 MG tablet Take 10 mg by mouth 2 (two) times daily as needed.     Safety Seal Miscellaneous MISC Melaxamic cream Tranexamic acid 5% Kojic acid USP 2% Vit C USP 2.5% hyaluronic acid EXCP 0.1% - apply pea size amount to face on nights when not using tretinoin  15 g 5   tretinoin  (RETIN-A ) 0.05 % cream Apply pea size amount to the face 2 nights a week on Tu/Th. In one month increase to 3 nights a week on MWF. 45 g 2   No facility-administered medications prior to visit.     Patient Active Problem List   Diagnosis Date Noted   Menorrhagia with regular cycle 05/26/2019   Primary dysmenorrhea 05/26/2019   ADHD (attention deficit hyperactivity disorder), combined type 06/06/2015   The following portions of the patient's history were reviewed and updated as appropriate: allergies, current medications, past family history, past medical history, past social history, past surgical history, and problem list.  Visual Observations/Objective:   General Appearance: Well nourished well developed, in no apparent distress.  Eyes: conjunctiva no swelling or erythema ENT/Mouth: No hoarseness, No cough for duration  of visit.  Neck: Supple  Respiratory: Respiratory effort normal, normal rate, no retractions or distress.   Cardio: Appears well-perfused, noncyanotic Musculoskeletal: no obvious deformity Skin: visible skin without rashes, ecchymosis, erythema Neuro: Awake and oriented X 3,  Psych:  normal affect, Insight and Judgment appropriate.    Assessment/Plan: 1. Encounter for initial prescription  of contraceptive pills (Primary) -restart first gen COC  -return precautions reviewed, including breakthrough bleeding, cramping  -start pill pack on Sunday after end of next period  -EC and condom use reviewed, including STI screening  - norethindrone -ethinyl estradiol-iron (JUNEL FE 1.5/30) 1.5-30 MG-MCG tablet; Take 1 tablet by mouth daily.  Dispense: 28 tablet; Refill: 11  I discussed the assessment and treatment plan with the patient and/or parent/guardian.  They were provided an opportunity to ask questions and all were answered.  They agreed with the plan and demonstrated an understanding of the instructions. They were advised to call back or seek an in-person evaluation in the emergency room if the symptoms worsen or if the condition fails to improve as anticipated.   Follow-up:   8 weeks or sooner if needed   Bari CHRISTELLA Molt, NP    CC: Clide Asberry BRAVO, MD, Clide Asberry BRAVO, MD

## 2023-10-19 ENCOUNTER — Encounter: Payer: Self-pay | Admitting: Family

## 2023-10-23 ENCOUNTER — Other Ambulatory Visit: Payer: Self-pay | Admitting: Family

## 2023-10-23 MED ORDER — ELLA 30 MG PO TABS: 1.0000 | ORAL_TABLET | Freq: Once | ORAL | 0 refills | Status: AC

## 2023-11-19 ENCOUNTER — Ambulatory Visit: Admitting: Dermatology

## 2023-11-19 VITALS — BP 104/66

## 2023-11-19 DIAGNOSIS — L814 Other melanin hyperpigmentation: Secondary | ICD-10-CM | POA: Diagnosis not present

## 2023-11-19 DIAGNOSIS — L700 Acne vulgaris: Secondary | ICD-10-CM | POA: Diagnosis not present

## 2023-11-19 NOTE — Progress Notes (Signed)
   Follow-Up Visit   Subjective  Morgan Durham is a 23 y.o. female who presents for the following: Acne follow up - she thinks she is doing better. She does still have small random breakouts. She is using clindamycin  in the mornings and tretinoin  0.05% cream Tuesday, Thursday and Saturday nights. On the nights that she does not use tretinoin , she is using compounded tranexamic acid from [employer].  She also following up on seb derm of the scalp which is doing better. She washes with [employer] Zinc every 2 weeks. She uses fluocinonide oil on her scalp but she only has to use it closer to the time to get her hair done.  The following portions of the chart were reviewed this encounter and updated as appropriate: medications, allergies, medical history  Review of Systems:  No other skin or systemic complaints except as noted in HPI or Assessment and Plan.  Objective  Well appearing patient in no apparent distress; mood and affect are within normal limits.   A focused examination was performed of the following areas: Face, scalp  Relevant exam findings are noted in the Assessment and Plan.    Assessment & Plan    Acne vulgaris Acne vulgaris is improving with the current topical regimen, with fewer outbreaks and improved skin appearance. Tiny congestive pores are present, which can be addressed with additional topical treatments. Oral spironolactone is not necessary at this time. - Continue current topical regimen: tretinoin  0.05% three nights a week, clindamycin  in the morning, and transdermic acid in the morning and at night. - Add La Roche-Posay Effeclar Duo with 0.5% salicylic acid to the morning routine to address congestive pores and improve skin texture. - Introduce a nighttime cream with zinc copper peptide and calming thermal water to be used on top of tretinoin  and on nights without tretinoin  to help repair the skin barrier. - Monitor skin tolerance to tretinoin ; if irritation occurs,  stop all treatments for a few days and reduce tretinoin  to two nights a week if necessary.  Lentigines of lips Lentigines on the lips are likely due to sun exposure. Sunscreen use on the lips is essential to prevent further pigmentation. - Mix transdermic acid with Vaseline and apply to the lips to lighten lentigines. - Use Sunbond Chapstick with SPF to protect lips from sun exposure.     Return in about 7 months (around 06/18/2024) for Acne.  I, Roseline Hutchinson, CMA, am acting as scribe for [employer], DO .   Documentation: I have reviewed the above documentation for accuracy and completeness, and I agree with the above.  Delon Lenis, DO

## 2023-11-19 NOTE — Patient Instructions (Signed)

## 2023-11-23 ENCOUNTER — Encounter: Payer: Self-pay | Admitting: Dermatology

## 2024-06-15 ENCOUNTER — Ambulatory Visit: Admitting: Dermatology
